# Patient Record
Sex: Male | Born: 1965 | Race: Black or African American | Hispanic: No | Marital: Married | Smoking: Never smoker
Health system: Southern US, Community
[De-identification: ages and names within clinical notes are randomized; demographics above are authoritative.]

## PROBLEM LIST (undated history)

## (undated) DIAGNOSIS — I10 Essential (primary) hypertension: Secondary | ICD-10-CM

## (undated) DIAGNOSIS — E119 Type 2 diabetes mellitus without complications: Secondary | ICD-10-CM

## (undated) DIAGNOSIS — N19 Unspecified kidney failure: Secondary | ICD-10-CM

## (undated) HISTORY — PX: OTHER SURGICAL HISTORY: SHX169

---

## 2016-02-27 ENCOUNTER — Encounter (HOSPITAL_COMMUNITY): Payer: Self-pay | Admitting: Emergency Medicine

## 2016-02-27 ENCOUNTER — Emergency Department (HOSPITAL_COMMUNITY)
Admission: EM | Admit: 2016-02-27 | Discharge: 2016-02-27 | Disposition: A | Discharge status: Home / Self Care | Payer: Self-pay | Attending: Dermatology | Admitting: Dermatology

## 2016-02-27 DIAGNOSIS — Z5321 Procedure and treatment not carried out due to patient leaving prior to being seen by health care provider: Secondary | ICD-10-CM | POA: Insufficient documentation

## 2016-02-27 DIAGNOSIS — Z992 Dependence on renal dialysis: Secondary | ICD-10-CM | POA: Insufficient documentation

## 2016-02-27 HISTORY — DX: Essential (primary) hypertension: I10

## 2016-02-27 HISTORY — DX: Type 2 diabetes mellitus without complications: E11.9

## 2016-02-27 NOTE — ED Triage Notes (Signed)
Pt comes from [place] where he normally gets dialysis T, TH, S but he did not come to the ED yesterday for it. Came here today for dialysis. Alert and oriented.

## 2016-02-27 NOTE — ED Notes (Addendum)
MD was made aware of patient who verbally ordered EKG and blood work. Pt refused these services and stated that he just wanted his dialysis. Pt left AMA. Pt was alert, oriented and ambulatory upon leaving.

## 2016-02-28 ENCOUNTER — Encounter (HOSPITAL_COMMUNITY): Payer: Self-pay | Admitting: Emergency Medicine

## 2016-02-28 DIAGNOSIS — E875 Hyperkalemia: Principal | ICD-10-CM | POA: Insufficient documentation

## 2016-02-28 DIAGNOSIS — E1122 Type 2 diabetes mellitus with diabetic chronic kidney disease: Secondary | ICD-10-CM | POA: Insufficient documentation

## 2016-02-28 DIAGNOSIS — R110 Nausea: Secondary | ICD-10-CM | POA: Insufficient documentation

## 2016-02-28 DIAGNOSIS — I120 Hypertensive chronic kidney disease with stage 5 chronic kidney disease or end stage renal disease: Secondary | ICD-10-CM | POA: Insufficient documentation

## 2016-02-28 DIAGNOSIS — Z79899 Other long term (current) drug therapy: Secondary | ICD-10-CM | POA: Insufficient documentation

## 2016-02-28 DIAGNOSIS — I160 Hypertensive urgency: Secondary | ICD-10-CM | POA: Insufficient documentation

## 2016-02-28 DIAGNOSIS — Z9115 Patient's noncompliance with renal dialysis: Secondary | ICD-10-CM | POA: Insufficient documentation

## 2016-02-28 DIAGNOSIS — N186 End stage renal disease: Secondary | ICD-10-CM | POA: Insufficient documentation

## 2016-02-28 DIAGNOSIS — Z992 Dependence on renal dialysis: Secondary | ICD-10-CM | POA: Insufficient documentation

## 2016-02-28 DIAGNOSIS — D631 Anemia in chronic kidney disease: Secondary | ICD-10-CM | POA: Insufficient documentation

## 2016-02-28 LAB — BASIC METABOLIC PANEL
ANION GAP: 14 (ref 5–15)
BUN: 91 mg/dL — ABNORMAL HIGH (ref 6–20)
CHLORIDE: 101 mmol/L (ref 101–111)
CO2: 24 mmol/L (ref 22–32)
Calcium: 8.4 mg/dL — ABNORMAL LOW (ref 8.9–10.3)
Creatinine, Ser: 15.79 mg/dL — ABNORMAL HIGH (ref 0.61–1.24)
GFR calc Af Amer: 4 mL/min — ABNORMAL LOW (ref >60)
GFR, EST NON AFRICAN AMERICAN: 3 mL/min — AB (ref >60)
GLUCOSE: 105 mg/dL — AB (ref 65–99)
POTASSIUM: 6 mmol/L — AB (ref 3.5–5.1)
SODIUM: 139 mmol/L (ref 135–145)

## 2016-02-28 LAB — CBC WITH DIFFERENTIAL/PLATELET
BASOS ABS: 0 10*3/uL (ref 0.0–0.1)
Basophils Relative: 0 %
EOS PCT: 7 %
Eosinophils Absolute: 0.4 10*3/uL (ref 0.0–0.7)
HCT: 34 % — ABNORMAL LOW (ref 39.0–52.0)
HEMOGLOBIN: 11.4 g/dL — AB (ref 13.0–17.0)
LYMPHS PCT: 43 %
Lymphs Abs: 2.2 10*3/uL (ref 0.7–4.0)
MCH: 26.8 pg (ref 26.0–34.0)
MCHC: 33.5 g/dL (ref 30.0–36.0)
MCV: 79.8 fL (ref 78.0–100.0)
Monocytes Absolute: 0.7 10*3/uL (ref 0.1–1.0)
Monocytes Relative: 14 %
NEUTROS ABS: 1.9 10*3/uL (ref 1.7–7.7)
NEUTROS PCT: 36 %
PLATELETS: 235 10*3/uL (ref 150–400)
RBC: 4.26 MIL/uL (ref 4.22–5.81)
RDW: 16.3 % — ABNORMAL HIGH (ref 11.5–15.5)
WBC: 5.3 10*3/uL (ref 4.0–10.5)

## 2016-02-28 NOTE — ED Notes (Signed)
Dr. Criss AlvineGoldston notified on pt.'s elevated blood pressure .

## 2016-02-28 NOTE — ED Triage Notes (Signed)
Pt. requesting hemodialysis treatment , he missed his dialysis Saturday , denies SOB / no pain or discomfort . Hypertensive at triage / refused blood tests done at triage .

## 2016-02-29 ENCOUNTER — Observation Stay (HOSPITAL_COMMUNITY)
Admission: EM | Admit: 2016-02-29 | Discharge: 2016-02-29 | Discharge status: Against Medical Advice | Payer: No Typology Code available for payment source | Attending: Internal Medicine | Admitting: Internal Medicine

## 2016-02-29 ENCOUNTER — Observation Stay (HOSPITAL_COMMUNITY): Payer: Self-pay

## 2016-02-29 ENCOUNTER — Observation Stay (HOSPITAL_COMMUNITY)
Admission: EM | Admit: 2016-02-29 | Discharge: 2016-03-01 | Discharge status: Against Medical Advice | Payer: Medicaid - Out of State | Attending: Internal Medicine | Admitting: Internal Medicine

## 2016-02-29 ENCOUNTER — Encounter (HOSPITAL_COMMUNITY): Payer: Self-pay | Admitting: Emergency Medicine

## 2016-02-29 ENCOUNTER — Encounter (HOSPITAL_COMMUNITY): Payer: Self-pay | Admitting: Internal Medicine

## 2016-02-29 DIAGNOSIS — Z992 Dependence on renal dialysis: Secondary | ICD-10-CM

## 2016-02-29 DIAGNOSIS — I160 Hypertensive urgency: Secondary | ICD-10-CM | POA: Diagnosis present

## 2016-02-29 DIAGNOSIS — I10 Essential (primary) hypertension: Secondary | ICD-10-CM | POA: Diagnosis not present

## 2016-02-29 DIAGNOSIS — I120 Hypertensive chronic kidney disease with stage 5 chronic kidney disease or end stage renal disease: Secondary | ICD-10-CM | POA: Insufficient documentation

## 2016-02-29 DIAGNOSIS — E875 Hyperkalemia: Principal | ICD-10-CM | POA: Diagnosis present

## 2016-02-29 DIAGNOSIS — Z89421 Acquired absence of other right toe(s): Secondary | ICD-10-CM | POA: Insufficient documentation

## 2016-02-29 DIAGNOSIS — N186 End stage renal disease: Secondary | ICD-10-CM | POA: Insufficient documentation

## 2016-02-29 DIAGNOSIS — E1122 Type 2 diabetes mellitus with diabetic chronic kidney disease: Secondary | ICD-10-CM | POA: Insufficient documentation

## 2016-02-29 DIAGNOSIS — D631 Anemia in chronic kidney disease: Secondary | ICD-10-CM | POA: Insufficient documentation

## 2016-02-29 HISTORY — DX: Unspecified kidney failure: N19

## 2016-02-29 LAB — CBC WITH DIFFERENTIAL/PLATELET
BASOS PCT: 1 %
Basophils Absolute: 0 10*3/uL (ref 0.0–0.1)
EOS ABS: 0.2 10*3/uL (ref 0.0–0.7)
Eosinophils Relative: 4 %
HCT: 33 % — ABNORMAL LOW (ref 39.0–52.0)
Hemoglobin: 11.3 g/dL — ABNORMAL LOW (ref 13.0–17.0)
Lymphocytes Relative: 41 %
Lymphs Abs: 2.2 10*3/uL (ref 0.7–4.0)
MCH: 26.7 pg (ref 26.0–34.0)
MCHC: 34.2 g/dL (ref 30.0–36.0)
MCV: 77.8 fL — ABNORMAL LOW (ref 78.0–100.0)
MONO ABS: 0.5 10*3/uL (ref 0.1–1.0)
MONOS PCT: 9 %
NEUTROS PCT: 45 %
Neutro Abs: 2.4 10*3/uL (ref 1.7–7.7)
Platelets: 248 10*3/uL (ref 150–400)
RBC: 4.24 MIL/uL (ref 4.22–5.81)
RDW: 15.9 % — AB (ref 11.5–15.5)
WBC: 5.3 10*3/uL (ref 4.0–10.5)

## 2016-02-29 LAB — CBC
HEMATOCRIT: 32.9 % — AB (ref 39.0–52.0)
Hemoglobin: 11.2 g/dL — ABNORMAL LOW (ref 13.0–17.0)
MCH: 26.5 pg (ref 26.0–34.0)
MCHC: 34 g/dL (ref 30.0–36.0)
MCV: 78 fL (ref 78.0–100.0)
PLATELETS: 231 10*3/uL (ref 150–400)
RBC: 4.22 MIL/uL (ref 4.22–5.81)
RDW: 16 % — AB (ref 11.5–15.5)
WBC: 6.3 10*3/uL (ref 4.0–10.5)

## 2016-02-29 LAB — COMPREHENSIVE METABOLIC PANEL
ALBUMIN: 3.7 g/dL (ref 3.5–5.0)
ALK PHOS: 73 U/L (ref 38–126)
ALT: 13 U/L — ABNORMAL LOW (ref 17–63)
ANION GAP: 15 (ref 5–15)
AST: 13 U/L — ABNORMAL LOW (ref 15–41)
BILIRUBIN TOTAL: 0.5 mg/dL (ref 0.3–1.2)
BUN: 98 mg/dL — AB (ref 6–20)
CO2: 20 mmol/L — ABNORMAL LOW (ref 22–32)
Calcium: 8.8 mg/dL — ABNORMAL LOW (ref 8.9–10.3)
Chloride: 103 mmol/L (ref 101–111)
Creatinine, Ser: 16.64 mg/dL — ABNORMAL HIGH (ref 0.61–1.24)
GFR calc Af Amer: 3 mL/min — ABNORMAL LOW (ref >60)
GFR, EST NON AFRICAN AMERICAN: 3 mL/min — AB (ref >60)
GLUCOSE: 113 mg/dL — AB (ref 65–99)
Potassium: 6 mmol/L — ABNORMAL HIGH (ref 3.5–5.1)
Sodium: 138 mmol/L (ref 135–145)
TOTAL PROTEIN: 7 g/dL (ref 6.5–8.1)

## 2016-02-29 LAB — GLUCOSE, CAPILLARY: Glucose-Capillary: 213 mg/dL — ABNORMAL HIGH (ref 65–99)

## 2016-02-29 LAB — BASIC METABOLIC PANEL
Anion gap: 18 — ABNORMAL HIGH (ref 5–15)
BUN: 93 mg/dL — AB (ref 6–20)
CHLORIDE: 101 mmol/L (ref 101–111)
CO2: 19 mmol/L — AB (ref 22–32)
CREATININE: 15.9 mg/dL — AB (ref 0.61–1.24)
Calcium: 8.4 mg/dL — ABNORMAL LOW (ref 8.9–10.3)
GFR calc Af Amer: 4 mL/min — ABNORMAL LOW (ref >60)
GFR calc non Af Amer: 3 mL/min — ABNORMAL LOW (ref >60)
GLUCOSE: 210 mg/dL — AB (ref 65–99)
Potassium: 5.6 mmol/L — ABNORMAL HIGH (ref 3.5–5.1)
SODIUM: 138 mmol/L (ref 135–145)

## 2016-02-29 LAB — LIPASE, BLOOD: LIPASE: 22 U/L (ref 11–51)

## 2016-02-29 LAB — MRSA PCR SCREENING: MRSA by PCR: NEGATIVE

## 2016-02-29 MED ORDER — CLONIDINE HCL 0.1 MG PO TABS
0.4000 mg | ORAL_TABLET | ORAL | Status: DC
  Administered 2016-02-29: 0.4 mg via ORAL
  Filled 2016-02-29: qty 4

## 2016-02-29 MED ORDER — HYDRALAZINE HCL 20 MG/ML IJ SOLN: 10.0000 mg | INTRAMUSCULAR | Status: DC | PRN

## 2016-02-29 MED ORDER — ONDANSETRON HCL 4 MG PO TABS: 4.0000 mg | ORAL_TABLET | Freq: Four times a day (QID) | ORAL | Status: DC | PRN

## 2016-02-29 MED ORDER — CLONIDINE HCL 0.1 MG PO TABS: 0.4000 mg | ORAL_TABLET | ORAL | Status: DC

## 2016-02-29 MED ORDER — HYDRALAZINE HCL 20 MG/ML IJ SOLN
20.0000 mg | Freq: Once | INTRAMUSCULAR | Status: AC
  Administered 2016-02-29: 20 mg via INTRAVENOUS
  Filled 2016-02-29: qty 1

## 2016-02-29 MED ORDER — SODIUM POLYSTYRENE SULFONATE 15 GM/60ML PO SUSP: 60.0000 g | Freq: Once | ORAL | Status: DC

## 2016-02-29 MED ORDER — MINOXIDIL 2.5 MG PO TABS
5.0000 mg | ORAL_TABLET | ORAL | Status: DC
  Administered 2016-02-29: 5 mg via ORAL
  Filled 2016-02-29: qty 2

## 2016-02-29 MED ORDER — ACETAMINOPHEN 325 MG PO TABS: 650.0000 mg | ORAL_TABLET | Freq: Four times a day (QID) | ORAL | Status: DC | PRN

## 2016-02-29 MED ORDER — ONDANSETRON HCL 4 MG/2ML IJ SOLN: 4.0000 mg | Freq: Four times a day (QID) | INTRAMUSCULAR | Status: DC | PRN

## 2016-02-29 MED ORDER — INSULIN ASPART 100 UNIT/ML ~~LOC~~ SOLN: 0.0000 [IU] | Freq: Three times a day (TID) | SUBCUTANEOUS | Status: DC

## 2016-02-29 MED ORDER — ACETAMINOPHEN 650 MG RE SUPP: 650.0000 mg | Freq: Four times a day (QID) | RECTAL | Status: DC | PRN

## 2016-02-29 MED ORDER — MINOXIDIL 2.5 MG PO TABS: 5.0000 mg | ORAL_TABLET | ORAL | Status: DC

## 2016-02-29 MED ORDER — CLONIDINE HCL 0.2 MG PO TABS: 0.3000 mg | ORAL_TABLET | Freq: Every day | ORAL | Status: DC

## 2016-02-29 MED ORDER — SODIUM CHLORIDE 0.9 % IV SOLN
1.0000 g | Freq: Once | INTRAVENOUS | Status: AC
  Administered 2016-02-29: 1 g via INTRAVENOUS
  Filled 2016-02-29: qty 10

## 2016-02-29 NOTE — Discharge Summary (Addendum)
Physician Discharge Summary  Keith Logan WUJ:[id number] DOB: [birth year] DOA: 02/29/2016  PCP: No primary care provider on file.  Admit date: 02/29/2016 Discharge date: 02/29/2016  Admitted From: Home Disposition: Home  LEAVING AGAINST MEDICAL ADVICE   Brief/Interim Summary: 50 year old male with a history of ESRD (T-Th-Sat), hypertension and diabetes mellitus presented with 1 day of nausea and nonspecific frontal headache.  [place] [facility] [place] [place]. He left [place] on 02/25/2016 and was supposed to return to [place] on 02/27/2016. Apparently he began developing some nausea and headache, and decided to come to the emergency department to get dialysis because that's what he does normally in [place]. He was noted to have a blood pressure up to 235/132 and was given hydralazine with improvement. Nephrology had been consulted and dialysis is being planned for the patient. However, on the early morning of 02/29/2016, the patient stated that he had to leave to return his rental car that he picked up in [place]. He stated there was no other family members that could help him with the logistics of his issue. I offered to write a letter explaining the patient's medical condition, and that he is currently hospitalized. The patient stated that this was unacceptable. I discussed the risks of leaving AGAINST MEDICAL ADVICE including but not limited to dysrhythmia, cardiac arrest, death, uremia and associated symptoms. He expressed understanding and signed out AGAINST MEDICAL ADVICE.    Discharge Condition:AMA CODE STATUS:FULL Diet recommendation: Renal   Discharge Diagnoses:  Nausea/ESRD/Uremia -Likely a symptom of the patient's uremia from missed dialysis -He missed his last scheduled dialysis on 02/26/2016 -Leaving AGAINST MEDICAL ADVICE  Hypertensive urgency -Received hydralazine with improvement of his blood pressure -Continue home medications including  clonidine and minoxidil -Leaving AGAINST MEDICAL ADVICE  Diabetes mellitus type 2 -Not on any agents outpatient -Leaving AGAINST MEDICAL ADVICE  Hyperkalemia -pt refused kayexalate  Anemia of CKD -Leaving its medical advice     Discharge Instructions     Medication List    TAKE these medications   cloNIDine 0.1 MG tablet Commonly known as:  CATAPRES Take 0.4 mg by mouth 3 (three) times a week. Take alone with a 0.3mg  tablet   cloNIDine 0.3 MG tablet Commonly known as:  CATAPRES Take 0.4 mg by mouth 3 (three) times a week. Take along with a 0.1mg  tablet   minoxidil 2.5 MG tablet Commonly known as:  LONITEN Take 5 mg by mouth 3 (three) times a week.       No Known Allergies  Consultations:  Nephrology   Procedures/Studies: Dg Chest 2 View  Result Date: 02/29/2016 CLINICAL DATA:  Missed dialysis. EXAM: CHEST  2 VIEW COMPARISON:  None. FINDINGS: Mild-to-moderate cardiomegaly. Pulmonary vasculature is normal. Minimal fissural thickening. No consolidation, pleural effusion, or pneumothorax. No acute osseous abnormalities are seen. IMPRESSION: Cardiomegaly.  No pulmonary edema. Electronically Signed   By: Rubye OaksMelanie  Ehinger M.D.   On: 02/29/2016 02:28        Discharge Exam: Vitals:   02/29/16 0530 02/29/16 0645  BP: (!) 162/90 133/78  Pulse: (!) 101 96  Resp: (!) 24 16  Temp:     Vitals:   02/29/16 0430 02/29/16 0500 02/29/16 0530 02/29/16 0645  BP: (!) 177/95 (!) 150/85 (!) 162/90 133/78  Pulse:  (!) 110 (!) 101 96  Resp:  (!) 22 (!) 24 16  Temp:      TempSrc:      SpO2:  97% 97%   Weight:  Height:        General: Pt is alert, awake, not in acute distress Cardiovascular: RRR, S1/S2 +, no rubs, no gallops Respiratory: CTA bilaterally, no wheezing, no rhonchi Abdominal: Soft, NT, ND, bowel sounds + Extremities: no edema, no cyanosis   The results of significant diagnostics from this hospitalization (including imaging, microbiology, ancillary  and laboratory) are listed below for reference.    Significant Diagnostic Studies: Dg Chest 2 View  Result Date: 02/29/2016 CLINICAL DATA:  Missed dialysis. EXAM: CHEST  2 VIEW COMPARISON:  None. FINDINGS: Mild-to-moderate cardiomegaly. Pulmonary vasculature is normal. Minimal fissural thickening. No consolidation, pleural effusion, or pneumothorax. No acute osseous abnormalities are seen. IMPRESSION: Cardiomegaly.  No pulmonary edema. Electronically Signed   By: Rubye Oaks M.D.   On: 02/29/2016 02:28     Microbiology: Recent Results (from the past 240 hour(s))  MRSA PCR Screening     Status: None   Collection Time: 02/29/16  2:45 AM  Result Value Ref Range Status   MRSA by PCR NEGATIVE NEGATIVE Final    Comment:        The GeneXpert MRSA Assay (FDA approved for NASAL specimens only), is one component of a comprehensive MRSA colonization surveillance program. It is not intended to diagnose MRSA infection nor to guide or monitor treatment for MRSA infections.      Labs: Basic Metabolic Panel:  Recent Labs Lab 02/28/16 2206 02/29/16 0357  NA 139 138  K 6.0* 5.6*  CL 101 101  CO2 24 19*  GLUCOSE 105* 210*  BUN 91* 93*  CREATININE 15.79* 15.90*  CALCIUM 8.4* 8.4*   Liver Function Tests: No results for input(s): AST, ALT, ALKPHOS, BILITOT, PROT, ALBUMIN in the last 168 hours. No results for input(s): LIPASE, AMYLASE in the last 168 hours. No results for input(s): AMMONIA in the last 168 hours. CBC:  Recent Labs Lab 02/28/16 2206 02/29/16 0357  WBC 5.3 6.3  NEUTROABS 1.9  --   HGB 11.4* 11.2*  HCT 34.0* 32.9*  MCV 79.8 78.0  PLT 235 231   Cardiac Enzymes: No results for input(s): CKTOTAL, CKMB, CKMBINDEX, TROPONINI in the last 168 hours. BNP: Invalid input(s): POCBNP CBG:  Recent Labs Lab 02/29/16 0325  GLUCAP 213*    Time coordinating discharge:  Greater than 30 minutes  Signed:  Porchia Sinkler, DO [facility] Pager:  [phone] 02/29/2016, 8:00 AM

## 2016-02-29 NOTE — ED Notes (Signed)
Pt. Stated, I just had labs this am around 200

## 2016-02-29 NOTE — ED Provider Notes (Addendum)
TIME SEEN:  By signing my name below, I, Arianna Nassar, attest that this documentation has been prepared under the direction and in the presence of Enbridge EnergyKristen N Lynette Noah, DO.  Electronically Signed: Octavia HeirArianna Nassar, ED Scribe. 02/29/16. 1:16 AM.   CHIEF COMPLAINT:  Chief Complaint  Patient presents with  . Other    Missed his hemodialysis 2 days ago     HPI:  HPI Comments: Keith Logan is a 50 y.o. male who has a PMhx of HTN, ESRD on HD and DM presents to the Emergency Department requesting hemodialysis treatment. Pt is a T/Th/Sat dialysis patient. He reports missing his dialysis treatment on Saturday due to a sudden death in the family. Pt normally gets his treatment in [place] and is staying in [place] until the end of the week. He reports feeling "very funny" and having one episode of vomiting once this morning. He also notes having high blood pressure when he does not receive his treatment but notes it alleviates ~ 15-20 minutes into his dialysis treatment. Pt also states he has taken his blood pressure medication once since he has been in [place]. Denies HA, fever, shortness of breath, chest pain, fever, or diarrhea.   ROS: See HPI Constitutional: no fever  Eyes: no drainage  ENT: no runny nose   Cardiovascular:  no chest pain  Resp: no SOB  GI: no vomiting GU: no dysuria Integumentary: no rash  Allergy: no hives  Musculoskeletal: no leg swelling  Neurological: no slurred speech ROS otherwise negative  PAST MEDICAL HISTORY/PAST SURGICAL HISTORY:  Past Medical History:  Diagnosis Date  . Diabetes mellitus without complication (HCC)   . Hypertension     MEDICATIONS:  Prior to Admission medications   Not on File    ALLERGIES:  No Known Allergies  SOCIAL HISTORY:  Social History  Substance Use Topics  . Smoking status: Never Smoker  . Smokeless tobacco: Never Used  . Alcohol use No    FAMILY HISTORY: No family history on file.  EXAM: BP (!) 232/129   Pulse 93    Temp 97.5 F (36.4 C) (Oral)   Resp 22   SpO2 98%  CONSTITUTIONAL: Alert and oriented and responds appropriately to questions. Well-appearing; well-nourished HEAD: Normocephalic EYES: Conjunctivae clear, PERRL ENT: normal nose; no rhinorrhea; moist mucous membranes NECK: Supple, no meningismus, no LAD  CARD: RRR; S1 and S2 appreciated; no murmurs, no clicks, no rubs, no gallops RESP: Normal chest excursion without splinting or tachypnea; breath sounds clear and equal bilaterally; no wheezes, no rhonchi, no rales, no hypoxia or respiratory distress, speaking full sentences ABD/GI: Normal bowel sounds; non-distended; soft, non-tender, no rebound, no guarding, no peritoneal signs BACK:  The back appears normal and is non-tender to palpation, there is no CVA tenderness EXT: left ab fistula with no swelling, warmth, or tenderness; 2+ radial pulses bilaterally; Normal ROM in all joints; non-tender to palpation; no edema; normal capillary refill; no cyanosis, no calf tenderness or swelling;  SKIN: Normal color for age and race; warm; no rash NEURO: Moves all extremities equally, sensation to light touch intact diffusely, cranial nerves II through XII intact PSYCH: The patient's mood and manner are appropriate. Grooming and personal hygiene are appropriate.  MEDICAL DECISION MAKING: Patient here requesting dialysis. Last dialysis was August 3. He is hyperkalemic and hypertensive. Does not appear volume overloaded. No metabolic acidosis. EKG shows slightly prolonged QTC with no old for comparison. No peaked T waves. No other interval changes. Discussed with Dr.  Coldonato with nephrology. He recommends giving the patient 60 g of Kayexalate. I will also give him IV hydralazine for blood pressure control. Will discuss with medicine for admission.  ED PROGRESS: 1:30 AM  Discussed patient's case with hospitalist, Dr. Toniann Fail.  Recommend admission to step down, observation bed.  I will place holding  orders per their request. Patient and family (if present) updated with plan. Care transferred to hospitalist service.  Hospitalist also requests that I obtain a chest x-ray on patient.  I reviewed all nursing notes, vitals, pertinent old records, EKGs, labs, imaging (as available).       EKG Interpretation  Date/Time:  Tuesday February 29 2016 00:01:48 EDT Ventricular Rate:  91 PR Interval:  162 QRS Duration: 94 QT Interval:  410 QTC Calculation: 504 R Axis:   48 Text Interpretation:  Normal sinus rhythm Biatrial enlargement Left ventricular hypertrophy Prolonged QT Abnormal ECG No old tracing to compare Confirmed by Lilienne Weins,  DO, Elaiza Shoberg ([id number]) on 02/29/2016 12:30:01 AM        I personally performed the services described in this documentation, which was scribed in my presence. The recorded information has been reviewed and is accurate.    Layla Maw April Colter, DO 02/29/16 0128    1:35 AM  Pt Refusing Kayexalate after being explained why he would need this medication and that the nephrologist requested. Agrees to hydralazine perfuse is any other medications except for his home medications until his dialysis. Explant he could have cardiac arrest from hyperkalemia but he still refuses medication. We'll keep him on a cardiac monitor.   Layla Maw Tyronda Vizcarrondo, DO 02/29/16 [phone]

## 2016-02-29 NOTE — ED Triage Notes (Signed)
Pt. Stated, Im having headache and vomiting.  Im due for dialysis today.

## 2016-02-29 NOTE — Consult Note (Signed)
Keith Logan is an 50 y.o. male referred by Dr Toniann FailKakrakandy   Chief Complaint: Hyperkalemia  HPI:  49yo BM with ESRD sec DM on HD in [place] on TTS and came to [place] to buy a car.  He did not arrange for any HD.  Was in ER earlier today but had to leave to complete car deal and now returns.  K 6.0.  1 episode of vomiting earlier but now feels well and is eating dinner.  BP high but says it comes down with HD.  Last HD was last Thurs.  CXR shows no pulm edema Past Medical History:  Diagnosis Date  . Diabetes mellitus without complication (HCC)   . Hypertension   . Renal failure     Past Surgical History:  Procedure Laterality Date  . right small toe amputation      Family History  Problem Relation Age of Onset  . Diabetes Mellitus II Father    Social History:  reports that he has never smoked. He has never used smokeless tobacco. He reports that he does not drink alcohol or use drugs.  Allergies: No Known Allergies   (Not in a hospital admission)   Lab Results: UA: ND  Recent Labs  02/28/16 2206 02/29/16 0357 02/29/16 1744  WBC 5.3 6.3 5.3  HGB 11.4* 11.2* 11.3*  HCT 34.0* 32.9* 33.0*  PLT 235 231 248   BMET  Recent Labs  02/28/16 2206 02/29/16 0357 02/29/16 1744  NA 139 138 138  K 6.0* 5.6* 6.0*  CL 101 101 103  CO2 24 19* 20*  GLUCOSE 105* 210* 113*  BUN 91* 93* 98*  CREATININE 15.79* 15.90* 16.64*  CALCIUM 8.4* 8.4* 8.8*   LFT  Recent Labs  02/29/16 1744  PROT 7.0  ALBUMIN 3.7  AST 13*  ALT 13*  ALKPHOS 73  BILITOT 0.5   Dg Chest 2 View  Result Date: 02/29/2016 CLINICAL DATA:  Missed dialysis. EXAM: CHEST  2 VIEW COMPARISON:  None. FINDINGS: Mild-to-moderate cardiomegaly. Pulmonary vasculature is normal. Minimal fissural thickening. No consolidation, pleural effusion, or pneumothorax. No acute osseous abnormalities are seen. IMPRESSION: Cardiomegaly.  No pulmonary edema. Electronically Signed   By: Rubye OaksMelanie  Ehinger M.D.   On: 02/29/2016 02:28    ROS:  No change in vision No SOB  No CP No Nausea No abd pain No arthritic CO No dysuria  PHYSICAL EXAM: Blood pressure (!) 203/113, pulse 94, temperature 97.9 F (36.6 C), temperature source Oral, resp. rate 16, height 6\' 3"  (1.905 m), weight 93 kg (205 lb), SpO2 100 %. HEENT: PERRLA EOMI NECK:No JVD LUNGS:Clear CARDIAC:RRR 2/6 systolic murmur vs radiating bruit from AVF ABD:+ BS NTND No HSM EXT:No edema.  LUA AVF + bruit NEURO:CNI Ox3, no asterixis  Assessment: 1. Hyperkalemia 2. ESRD 3. DM 4. HTN 5. Anemia PLAN: 1. Emergent HD 2. Resume his usual BP meds 3. If BP ok and tolerates HD well, he can be DC'd after HD.  Encouraged him to return to [place] before thurs to resume HD at his usual unit   Keron Koffman T 02/29/2016, 8:08 PM

## 2016-02-29 NOTE — ED Notes (Signed)
Patient transported to X-ray 

## 2016-02-29 NOTE — ED Notes (Signed)
Pt refuses to take Kayexalate; Pt advised of K+ level and the dangers of it increasing; Pt states he will wait til he is Dialysis and will have wife to bring home meds; pt was advised of Hospital policy concerning taking  home meds while a pt in hospital. ; Dr.Ward notified that pt refused Kayexalate.

## 2016-02-29 NOTE — ED Notes (Signed)
Md aware pt in room and placed on monitor; Pt appears to be stable with no acute symptoms

## 2016-02-29 NOTE — H&P (Signed)
History and Physical    Keith Stablendre E Benjamin EAV:[id number] DOB: 08/08/1965 DOA: 02/29/2016  PCP: No PCP Per Patient  Patient coming from: Home.  Chief Complaint: Needing dialysis.  HPI: Keith Logan is a 50 y.o. male with ESRD on hemodialysis, hypertension and diabetes mellitus presently off medications who had been admitted last night but left AMA as patient had to deal with his car presents back to the ER with needing dialysis. Patient has had at least 2 episodes of vomiting. Denies any abdominal pain chest pain or shortness of breath. On-call nephrologist was consulted. Patient's potassium is around 6. Patient is going for urgent dialysis. In addition patient's blood pressure is found to be markedly elevated more than 200 and was given IV hydralazine.  ED Course: See history of presenting illness.  Review of Systems: As per HPI, rest all negative.   Past Medical History:  Diagnosis Date  . Diabetes mellitus without complication (HCC)   . Hypertension   . Renal failure     Past Surgical History:  Procedure Laterality Date  . right small toe amputation       reports that he has never smoked. He has never used smokeless tobacco. He reports that he does not drink alcohol or use drugs.  No Known Allergies  Family History  Problem Relation Age of Onset  . Diabetes Mellitus II Father     Prior to Admission medications   Medication Sig Start Date End Date Taking? Authorizing Provider  clobetasol cream (TEMOVATE) 0.05 % Apply 1 application topically 2 (two) times daily.   Yes Historical Provider, MD  cloNIDine (CATAPRES) 0.1 MG tablet Take 0.4 mg by mouth 3 (three) times a week. Take alone with a 0.3mg  tablet   Yes Historical Provider, MD  minoxidil (LONITEN) 2.5 MG tablet Take 5 mg by mouth 3 (three) times a week.   Yes Historical Provider, MD    Physical Exam: Vitals:   02/29/16 1900 02/29/16 1930 02/29/16 2000 02/29/16 2045  BP: (!) 194/112 (!) 207/130 (!) 228/127 (!)  217/122  Pulse: 97 101 105 (!) 103  Resp:    14  Temp:    98.4 F (36.9 C)  TempSrc:    Oral  SpO2: 97% 100% 100% 99%  Weight:    206 lb 2.1 oz (93.5 kg)  Height:    6\' 3"  (1.905 m)      Constitutional: Not in distress. Vitals:   02/29/16 1900 02/29/16 1930 02/29/16 2000 02/29/16 2045  BP: (!) 194/112 (!) 207/130 (!) 228/127 (!) 217/122  Pulse: 97 101 105 (!) 103  Resp:    14  Temp:    98.4 F (36.9 C)  TempSrc:    Oral  SpO2: 97% 100% 100% 99%  Weight:    206 lb 2.1 oz (93.5 kg)  Height:    6\' 3"  (1.905 m)   Eyes: Anicteric no pallor. ENMT: No discharge from the ears eyes nose or mouth. Neck: No mass felt. Respiratory: No rhonchi or crepitations. Cardiovascular: S1-S2 heard. Abdomen: Soft nontender bowel sounds present. Musculoskeletal: No edema. Skin: No rash. Neurologic: Alert awake oriented to time place and person. Moves all extremities. Psychiatric: Appears normal.   Labs on Admission: I have personally reviewed following labs and imaging studies  CBC:  Recent Labs Lab 02/28/16 2206 02/29/16 0357 02/29/16 1744  WBC 5.3 6.3 5.3  NEUTROABS 1.9  --  2.4  HGB 11.4* 11.2* 11.3*  HCT 34.0* 32.9* 33.0*  MCV 79.8 78.0 77.8*  PLT 235 231 248   Basic Metabolic Panel:  Recent Labs Lab 02/28/16 2206 02/29/16 0357 02/29/16 1744  NA 139 138 138  K 6.0* 5.6* 6.0*  CL 101 101 103  CO2 24 19* 20*  GLUCOSE 105* 210* 113*  BUN 91* 93* 98*  CREATININE 15.79* 15.90* 16.64*  CALCIUM 8.4* 8.4* 8.8*   GFR: Estimated Creatinine Clearance: 6.4 mL/min (by C-G formula based on SCr of 16.64 mg/dL). Liver Function Tests:  Recent Labs Lab 02/29/16 1744  AST 13*  ALT 13*  ALKPHOS 73  BILITOT 0.5  PROT 7.0  ALBUMIN 3.7    Recent Labs Lab 02/29/16 1744  LIPASE 22   No results for input(s): AMMONIA in the last 168 hours. Coagulation Profile: No results for input(s): INR, PROTIME in the last 168 hours. Cardiac Enzymes: No results for input(s): CKTOTAL,  CKMB, CKMBINDEX, TROPONINI in the last 168 hours. BNP (last 3 results) No results for input(s): PROBNP in the last 8760 hours. HbA1C: No results for input(s): HGBA1C in the last 72 hours. CBG:  Recent Labs Lab 02/29/16 0325  GLUCAP 213*   Lipid Profile: No results for input(s): CHOL, HDL, LDLCALC, TRIG, CHOLHDL, LDLDIRECT in the last 72 hours. Thyroid Function Tests: No results for input(s): TSH, T4TOTAL, FREET4, T3FREE, THYROIDAB in the last 72 hours. Anemia Panel: No results for input(s): VITAMINB12, FOLATE, FERRITIN, TIBC, IRON, RETICCTPCT in the last 72 hours. Urine analysis: No results found for: COLORURINE, APPEARANCEUR, LABSPEC, PHURINE, GLUCOSEU, HGBUR, BILIRUBINUR, KETONESUR, PROTEINUR, UROBILINOGEN, NITRITE, LEUKOCYTESUR Sepsis Labs: (procalcitonin:4,lacticidven:4) ) Recent Results (from the past 240 hour(s))  MRSA PCR Screening     Status: None   Collection Time: 02/29/16  2:45 AM  Result Value Ref Range Status   MRSA by PCR NEGATIVE NEGATIVE Final    Comment:        The GeneXpert MRSA Assay (FDA approved for NASAL specimens only), is one component of a comprehensive MRSA colonization surveillance program. It is not intended to diagnose MRSA infection nor to guide or monitor treatment for MRSA infections.      Radiological Exams on Admission: Dg Chest 2 View  Result Date: 02/29/2016 CLINICAL DATA:  Missed dialysis. EXAM: CHEST  2 VIEW COMPARISON:  None. FINDINGS: Mild-to-moderate cardiomegaly. Pulmonary vasculature is normal. Minimal fissural thickening. No consolidation, pleural effusion, or pneumothorax. No acute osseous abnormalities are seen. IMPRESSION: Cardiomegaly.  No pulmonary edema. Electronically Signed   By: Rubye Oaks M.D.   On: 02/29/2016 02:28    Assessment/Plan Active Problems:   Hyperkalemia    1. Hyperkalemia in a patient with ESRD on hemodialysis - patient usually gets dialyzed on Tuesday and Thursdays and Saturday and  had missed his dialysis last Saturday since patient is visiting from [place]. At this time nephrology has been already consulted and patient is going for urgent dialysis. 2. Hypertensive urgency - I think patient's blood pressure will improve after dialysis. For now patient is on when necessary IV hydralazine and continue patient's home dose of clonidine and minoxidil. 3. Diabetes mellitus type 2 on diet - will place patient on sliding scale coverage. 4. Anemia probably from ESRD - follow CBC.   DVT prophylaxis: SCDs. Code Status: Full code.  Family Communication: Discussed with patient.  Disposition Plan: Home.  Consults called: Nephrology.  Admission status: Observation.    Eduard Clos MD [facility] Pager [phone].  If 7PM-7AM, please contact night-coverage [email or web address] Password [facility]  02/29/2016, 9:32 PM

## 2016-02-29 NOTE — Progress Notes (Signed)
Patient is leaving AMA. Rn provided education as to why he should stay. Pt felt he has to leave and stated he would return to ED after returning his car rental. The MD came in to talk to him as well. AMA was signed.

## 2016-02-29 NOTE — ED Notes (Signed)
Dr.Kay at bedside 

## 2016-02-29 NOTE — H&P (Signed)
History and Physical    Keith Logan ZOX:[id number] DOB: 1966/04/15 DOA: 02/29/2016  PCP: No primary care provider on file.  Patient coming from: Home.  Patient is visiting [place] from the ER.  Chief Complaint: Nausea.  HPI: Keith Stablendre E Baquero is a 50 y.o. male with ESRD on hemodialysis for last 1 year, hypertension and diabetes mellitus presents to the ER because of nausea. Has also had mild headaches. Denies any chest pain or shortness of breath. In the ER patient's blood pressure is more than 220 systolic. Chest x-ray was unremarkable. Blood work show hyperkalemia with potassium around 6. Patient usually gets dialyzed on Tuesday Thursday and Saturday and had missed his dialysis last Saturday 3 days ago. On-call nephrologist Dr. Arrie Aranoladonato was consulted by the ER physician and patient is being admitted for further management of his hypertensive urgency and needs for dialysis. Patient's abdomen appears benign.  ED Course: Kayexalate 15 g was given and hydralazine IV 10 mg was given.  Review of Systems: As per HPI, rest all negative.   Past Medical History:  Diagnosis Date  . Diabetes mellitus without complication (HCC)   . Hypertension     Past Surgical History:  Procedure Laterality Date  . right small toe amputation       reports that he has never smoked. He has never used smokeless tobacco. He reports that he does not drink alcohol or use drugs.  No Known Allergies  Family History  Problem Relation Age of Onset  . Diabetes Mellitus II Father     Prior to Admission medications   Medication Sig Start Date End Date Taking? Authorizing Provider  cloNIDine (CATAPRES) 0.1 MG tablet Take 0.4 mg by mouth 3 (three) times a week. Take alone with a 0.3mg  tablet   Yes Historical Provider, MD  cloNIDine (CATAPRES) 0.3 MG tablet Take 0.4 mg by mouth 3 (three) times a week. Take along with a 0.1mg  tablet   Yes Historical Provider, MD  minoxidil (LONITEN) 2.5 MG tablet Take 5 mg by  mouth 3 (three) times a week.   Yes Historical Provider, MD    Physical Exam: Vitals:   02/29/16 0100 02/29/16 0130 02/29/16 0245 02/29/16 0246  BP: (!) 225/130 (!) 219/143 (!) 193/109 (!) 193/109  Pulse: 101 104 (!) 111   Resp: 15 19 17    Temp:   98.4 F (36.9 C)   TempSrc:   Oral   SpO2: 100% 100% 98%   Weight:   201 lb 15.1 oz (91.6 kg)   Height:   6\' 3"  (1.905 m)       Constitutional: Not in distress. Vitals:   02/29/16 0100 02/29/16 0130 02/29/16 0245 02/29/16 0246  BP: (!) 225/130 (!) 219/143 (!) 193/109 (!) 193/109  Pulse: 101 104 (!) 111   Resp: 15 19 17    Temp:   98.4 F (36.9 C)   TempSrc:   Oral   SpO2: 100% 100% 98%   Weight:   201 lb 15.1 oz (91.6 kg)   Height:   6\' 3"  (1.905 m)    Eyes: Anicteric no pallor. ENMT: No discharge from the ears eyes nose and mouth. Neck: No mass felt. No neck rigidity. Respiratory: No rhonchi or crepitations. Cardiovascular: S1 and S2 heard. Abdomen: Soft nontender bowel sounds present. No guarding or rigidity. Musculoskeletal: No edema. Skin: No rash. Neurologic: Alert awake oriented to time place and person. Moves all extremities. Psychiatric: Appears normal.   Labs on Admission: I have personally reviewed following labs  and imaging studies  CBC:  Recent Labs Lab 02/28/16 2206  WBC 5.3  NEUTROABS 1.9  HGB 11.4*  HCT 34.0*  MCV 79.8  PLT 235   Basic Metabolic Panel:  Recent Labs Lab 02/28/16 2206  NA 139  K 6.0*  CL 101  CO2 24  GLUCOSE 105*  BUN 91*  CREATININE 15.79*  CALCIUM 8.4*   GFR: Estimated Creatinine Clearance: 6.8 mL/min (by C-G formula based on SCr of 15.79 mg/dL). Liver Function Tests: No results for input(s): AST, ALT, ALKPHOS, BILITOT, PROT, ALBUMIN in the last 168 hours. No results for input(s): LIPASE, AMYLASE in the last 168 hours. No results for input(s): AMMONIA in the last 168 hours. Coagulation Profile: No results for input(s): INR, PROTIME in the last 168 hours. Cardiac  Enzymes: No results for input(s): CKTOTAL, CKMB, CKMBINDEX, TROPONINI in the last 168 hours. BNP (last 3 results) No results for input(s): PROBNP in the last 8760 hours. HbA1C: No results for input(s): HGBA1C in the last 72 hours. CBG: No results for input(s): GLUCAP in the last 168 hours. Lipid Profile: No results for input(s): CHOL, HDL, LDLCALC, TRIG, CHOLHDL, LDLDIRECT in the last 72 hours. Thyroid Function Tests: No results for input(s): TSH, T4TOTAL, FREET4, T3FREE, THYROIDAB in the last 72 hours. Anemia Panel: No results for input(s): VITAMINB12, FOLATE, FERRITIN, TIBC, IRON, RETICCTPCT in the last 72 hours. Urine analysis: No results found for: COLORURINE, APPEARANCEUR, LABSPEC, PHURINE, GLUCOSEU, HGBUR, BILIRUBINUR, KETONESUR, PROTEINUR, UROBILINOGEN, NITRITE, LEUKOCYTESUR Sepsis Labs: (procalcitonin:4,lacticidven:4) )No results found for this or any previous visit (from the past 240 hour(s)).   Radiological Exams on Admission: Dg Chest 2 View  Result Date: 02/29/2016 CLINICAL DATA:  Missed dialysis. EXAM: CHEST  2 VIEW COMPARISON:  None. FINDINGS: Mild-to-moderate cardiomegaly. Pulmonary vasculature is normal. Minimal fissural thickening. No consolidation, pleural effusion, or pneumothorax. No acute osseous abnormalities are seen. IMPRESSION: Cardiomegaly.  No pulmonary edema. Electronically Signed   By: Rubye Oaks M.D.   On: 02/29/2016 02:28    EKG: Independently reviewed. Normal sinus rhythm with LVH.  Assessment/Plan Principal Problem:   Hypertensive urgency Active Problems:   ESRD needing dialysis (HCC)    1. Hypertensive urgency - most likely from missing his dialysis. Patient takes clonidine only on the days of dialysis. At this time we are restarting his home medications clonidine and minoxidil and giving 1 dose now. When necessary IV hydralazine for systolic blood pressure more than 160. 2. ESRD on hemodialysis with mild hyperkalemia - Kayexalate  15 g was given. Recheck metabolic panel. Nephrologist Dr.Coladonato was consulted by the ER physician for dialysis. 3. Diabetes mellitus type 2 presently on no medications - has been placed on every diet and sliding scale coverage. 4. Mild anemia probably from ESRD - follow CBC. 5. Nausea probably from missing dialysis and hypertension. Abdomen appears benign. Closely observe.   DVT prophylaxis: SCDs. Code Status: Full code.  Family Communication: Discussed with patient.  Disposition Plan: Home.  Consults called: Nephrologist.  Admission status: Observation. Stepdown.    Eduard Clos MD [facility] Pager [phone].  If 7PM-7AM, please contact night-coverage [email or web address] Password [email or web address]  02/29/2016, 2:51 AM

## 2016-02-29 NOTE — ED Provider Notes (Signed)
[facility]-EMERGENCY DEPT Provider Note   CSN: [id number] Arrival date & time: 02/29/16  1402  First Provider Contact:  None       History   Chief Complaint Chief Complaint  Patient presents with  . Headache  . Emesis  . Vascular Access Problem    HPI Keith Logan is a 50 y.o. male.  HPI This patient ESRD who presents to receive dialysis. He is visiting from [place], was seen here yesterday to get dialysis, but left AMA. He returns today requesting dialysis again. States that his blood pressure is high. He had some vomiting this morning, this now resolved. Denies abdominal pain. Denies a headache. Denies fevers.  Past Medical History:  Diagnosis Date  . Diabetes mellitus without complication (HCC)   . Hypertension   . Renal failure     Patient Active Problem List   Diagnosis Date Noted  . ESRD needing dialysis (HCC) 02/29/2016  . Hypertensive urgency 02/29/2016  . Essential hypertension   . Hyperkalemia     Past Surgical History:  Procedure Laterality Date  . right small toe amputation         Home Medications    Prior to Admission medications   Medication Sig Start Date End Date Taking? Authorizing Provider  clobetasol cream (TEMOVATE) 0.05 % Apply 1 application topically 2 (two) times daily.   Yes Historical Provider, MD  cloNIDine (CATAPRES) 0.1 MG tablet Take 0.4 mg by mouth 3 (three) times a week. Take alone with a 0.3mg  tablet   Yes Historical Provider, MD  minoxidil (LONITEN) 2.5 MG tablet Take 5 mg by mouth 3 (three) times a week.   Yes Historical Provider, MD    Family History Family History  Problem Relation Age of Onset  . Diabetes Mellitus II Father     Social History Social History  Substance Use Topics  . Smoking status: Never Smoker  . Smokeless tobacco: Never Used  . Alcohol use No     Allergies   Review of patient's allergies indicates no known allergies.   Review of Systems Review of Systems  Constitutional: Negative for  chills and fever.  HENT: Negative for ear pain and sore throat.   Eyes: Negative for pain and visual disturbance.  Respiratory: Negative for cough and shortness of breath.   Cardiovascular: Negative for chest pain and palpitations.  Gastrointestinal: Positive for vomiting. Negative for abdominal pain.  Genitourinary: Negative for dysuria and hematuria.  Musculoskeletal: Negative for arthralgias and back pain.  Skin: Negative for color change and rash.  Neurological: Negative for seizures and syncope.  All other systems reviewed and are negative.    Physical Exam Updated Vital Signs BP (!) 203/113   Pulse 94   Temp 97.9 F (36.6 C) (Oral)   Resp 16   Ht 6\' 3"  (1.905 m)   Wt 93 kg   SpO2 100%   BMI 25.62 kg/m   Physical Exam  Constitutional: He appears well-developed and well-nourished.  HENT:  Head: Normocephalic and atraumatic.  Eyes: Conjunctivae are normal.  Neck: Neck supple.  Cardiovascular: Normal rate and regular rhythm.   No murmur heard. Pulmonary/Chest: Effort normal and breath sounds normal. No respiratory distress.  Abdominal: Soft. There is no tenderness.  Musculoskeletal: He exhibits no edema.  Neurological: He is alert.  Skin: Skin is warm and dry.  Psychiatric: He has a normal mood and affect.  Nursing note and vitals reviewed.    ED Treatments / Results  Labs (all labs ordered are  listed, but only abnormal results are displayed) Labs Reviewed  COMPREHENSIVE METABOLIC PANEL - Abnormal; Notable for the following:       Result Value   Potassium 6.0 (*)    CO2 20 (*)    Glucose, Bld 113 (*)    BUN 98 (*)    Creatinine, Ser 16.64 (*)    Calcium 8.8 (*)    AST 13 (*)    ALT 13 (*)    GFR calc non Af Amer 3 (*)    GFR calc Af Amer 3 (*)    All other components within normal limits  CBC WITH DIFFERENTIAL/PLATELET - Abnormal; Notable for the following:    Hemoglobin 11.3 (*)    HCT 33.0 (*)    MCV 77.8 (*)    RDW 15.9 (*)    All other  components within normal limits  LIPASE, BLOOD    EKG  EKG Interpretation None       Radiology Dg Chest 2 View  Result Date: 02/29/2016 CLINICAL DATA:  Missed dialysis. EXAM: CHEST  2 VIEW COMPARISON:  None. FINDINGS: Mild-to-moderate cardiomegaly. Pulmonary vasculature is normal. Minimal fissural thickening. No consolidation, pleural effusion, or pneumothorax. No acute osseous abnormalities are seen. IMPRESSION: Cardiomegaly.  No pulmonary edema. Electronically Signed   By: Rubye Oaks M.D.   On: 02/29/2016 02:28    Procedures Procedures (including critical care time)  Medications Ordered in ED Medications  calcium gluconate 1 g in sodium chloride 0.9 % 100 mL IVPB (1 g Intravenous New Bag/Given 02/29/16 1851)     Initial Impression / Assessment and Plan / ED Course  I have reviewed the triage vital signs and the nursing notes.  Pertinent labs & imaging results that were available during my care of the patient were reviewed by me and considered in my medical decision making (see chart for details).  Clinical Course    Potassium here 6. Patient given temporizing measures. Nephrology consulted for dialysis, the patient be admitted to hospitalist.  Final Clinical Impressions(s) / ED Diagnoses   Final diagnoses:  None    New Prescriptions New Prescriptions   No medications on file     Marcelina Morel, MD 03/01/16 0139    Pricilla Loveless, MD 03/02/16 [phone]

## 2016-02-29 NOTE — Progress Notes (Signed)
Amy RN (PM) & I went in to assess pt together during report. At that time he informed us he needed to leave to 1) return a rental care & 2) pick up a car he said he paid for last night before coming to the ED. Amy RN & I encouraged him to call the rental company in order to have them procure the car and to call the person selling the car to explain he is in the hospital. We also discussed his lab work with him and stressed to him that he could have serious health consequences such a s a heart attack. He stated, " I understand all that, but I have to take care of my business." Tat MD was notified and came to bedside where he discussed risks/ benefits of AMA with patient.  Patient signed AMA form, his IV was removed and he left the unit.

## 2016-02-29 NOTE — ED Notes (Signed)
Dinner tray at bedside

## 2016-02-29 NOTE — ED Notes (Signed)
Dr. Ward at bedside.

## 2016-02-29 NOTE — ED Notes (Signed)
Nephrology MD at bedside

## 2016-03-01 LAB — MRSA PCR SCREENING: MRSA BY PCR: NEGATIVE

## 2016-03-01 LAB — HEPATITIS B SURFACE ANTIGEN: Hepatitis B Surface Ag: NEGATIVE

## 2016-03-01 NOTE — Progress Notes (Signed)
Patient presented to dialysis from 2H17. Pt started on HD treatment as ordered without difficulty. During treatment, pt reports that he missed treatment on Saturday because his cousin died and he had to come here from [place]. Patient reports planning to go back to [place] on Friday but doesn't elaborate on plans to received dialysis before he returns. Approximately halfway through treatment, patient c/o severe cramping in abdomen and calf muscles. UF turned off at patient request. UF off x 10 minutes with no improvement in cramping. Offered saline bolus to help with cramping. Patient refused. At 0115, patient stated "take me off". Again offered saline bolus to help with cramping and stay on treatment. Patient again refused repeating "take me off". AMA form signed and treatment ended. Needles pulled and report called. Patient returned to room by this nurse. Primary nurse made aware of patient's intent to go home after treatment.

## 2016-03-01 NOTE — Progress Notes (Signed)
This RN received a phone call from dialysis RN stating patient claimed he was leaving once dialysis was completed. Paged [facility] to see about discharge orders, MD requested patient stay until morning rounds for further evaluation. Upon arrival to floor, patient pulled off leads, pulse ox, etc. and started putting on his clothes. RN explained to patient the importance of staying and patient insisted that he was leaving. RN explained he would have to sign an AMA form and patient agreed. Pt was A&O x4 when risks were explained and pt expressed he was fine and understood. MD notified of pt leaving AMA. RN escorted pt off unit.

## 2016-03-16 NOTE — Discharge Summary (Addendum)
Discharge summary -    HPI: Keith Logan is a 50 y.o. male with ESRD on hemodialysis, hypertension and diabetes mellitus presently off medications who had been admitted last night but left AMA as patient had to deal with his car presents back to the ER with needing dialysis. Patient has had at least 2 episodes of vomiting. Denies any abdominal pain chest pain or shortness of breath. On-call nephrologist was consulted. Patient's potassium is around 6. Patient is going for urgent dialysis. In addition patient's blood pressure is found to be markedly elevated more than 200 and was given IV hydralazine.  Course in the hospital - nephrology was consulted and patient received dialysis and before he could reach the floor patient left AMA before any specific advice and discharge medications could be given.  Final diagnosis -   #1. Hyperkalemia in a patient with ESRD on hemodialysis with missed dialysis. #2. Hypertensive urgency. #3. Diabetes mellitus type 2 on diet. #4. Anemia probably from ESRD.  Plan - patient left Against medical advise before patient can be given advise, instruction and discharge medications.

## 2018-03-26 IMAGING — DX DG CHEST 2V
2 series · 2 of 2 positions shown · non-contrast
Comparison: None.

CLINICAL DATA: Missed dialysis.

EXAM:
CHEST  2 VIEW

[chest pa]
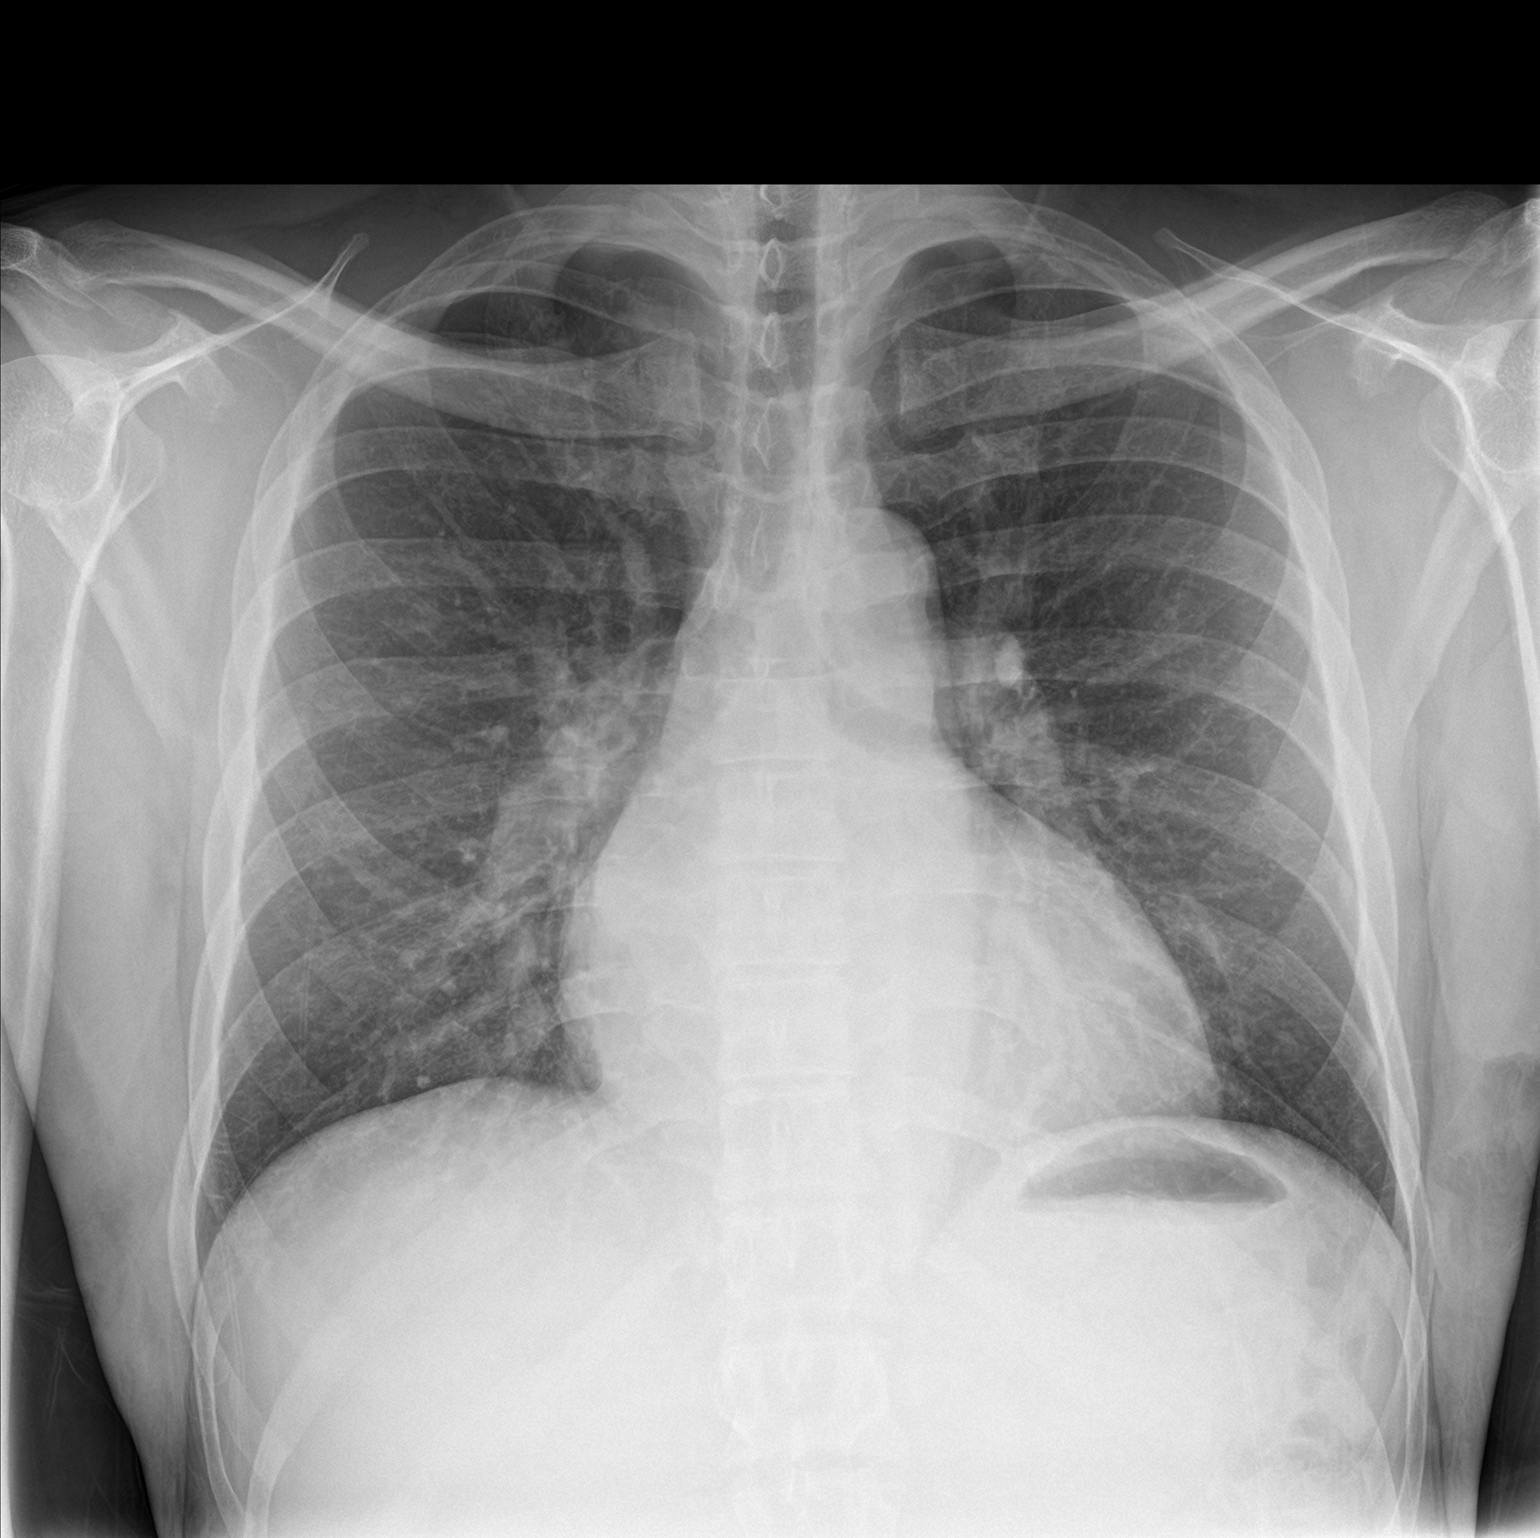

[chest lat]
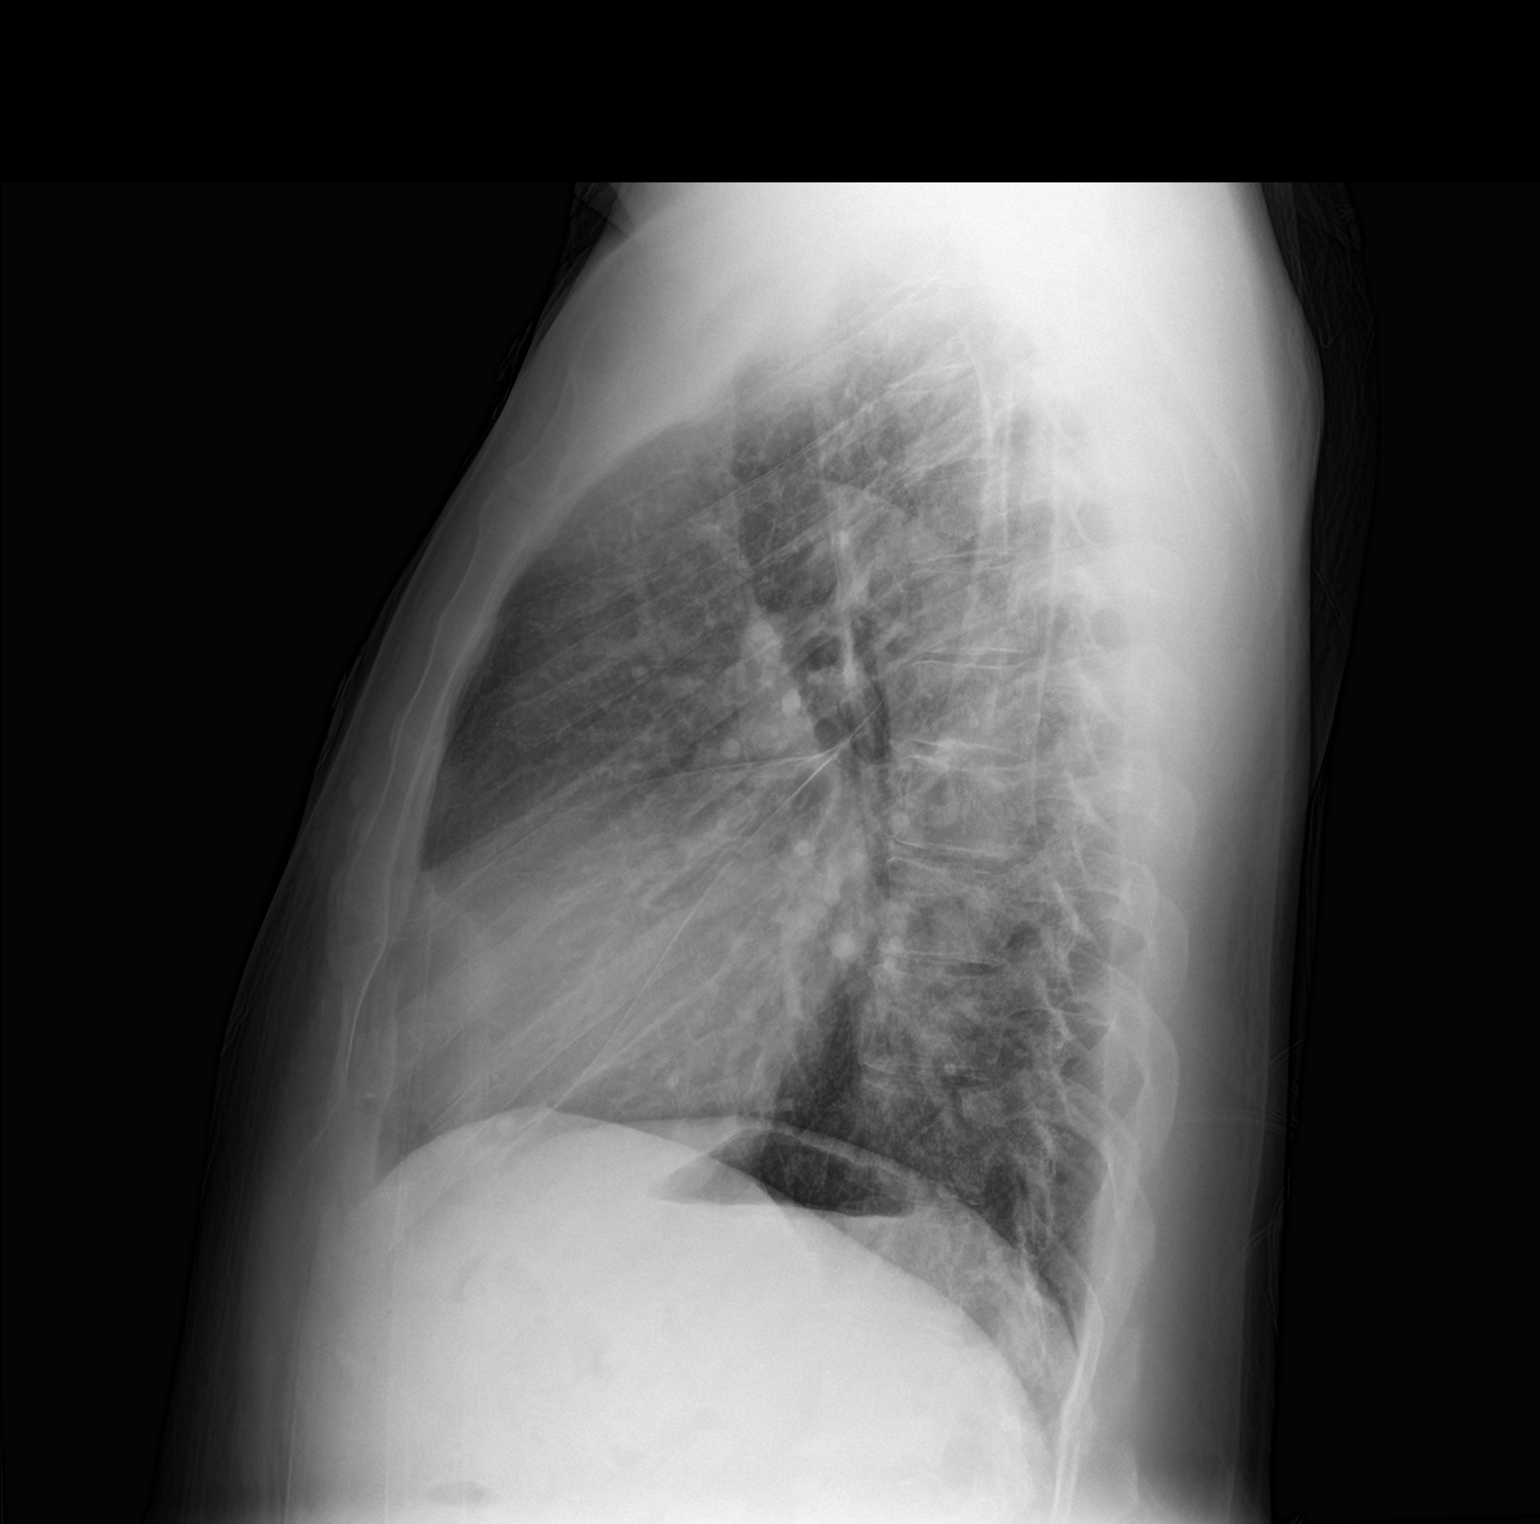

[2 of 2 positions shown; findings below may reference images not displayed]

FINDINGS: Mild-to-moderate cardiomegaly. Pulmonary vasculature is normal.
Minimal fissural thickening. No consolidation, pleural effusion, or
pneumothorax. No acute osseous abnormalities are seen.
IMPRESSION: Cardiomegaly.  No pulmonary edema.
